# Patient Record
Sex: Female | Born: 1970 | Race: White | Hispanic: No | Marital: Married | Smoking: Never smoker
Health system: Southern US, Community
[De-identification: ages and names within clinical notes are randomized; demographics above are authoritative.]

## PROBLEM LIST (undated history)

## (undated) DIAGNOSIS — G43909 Migraine, unspecified, not intractable, without status migrainosus: Secondary | ICD-10-CM

---

## 2007-11-18 ENCOUNTER — Ambulatory Visit: Payer: Self-pay | Admitting: Family Medicine

## 2008-10-04 ENCOUNTER — Ambulatory Visit: Payer: Self-pay | Admitting: Obstetrics and Gynecology

## 2008-10-07 ENCOUNTER — Ambulatory Visit: Payer: Self-pay | Admitting: Obstetrics and Gynecology

## 2009-12-01 ENCOUNTER — Ambulatory Visit: Payer: Self-pay | Admitting: Obstetrics and Gynecology

## 2010-02-01 ENCOUNTER — Emergency Department: Payer: Self-pay | Admitting: Emergency Medicine

## 2012-12-16 ENCOUNTER — Ambulatory Visit: Payer: Self-pay | Admitting: Obstetrics and Gynecology

## 2013-01-07 ENCOUNTER — Ambulatory Visit: Payer: Self-pay | Admitting: Family Medicine

## 2014-05-03 ENCOUNTER — Ambulatory Visit: Payer: Self-pay | Admitting: Obstetrics and Gynecology

## 2015-08-05 ENCOUNTER — Ambulatory Visit
Admission: EM | Admit: 2015-08-05 | Discharge: 2015-08-05 | Disposition: A | Discharge status: Home / Self Care | Payer: Managed Care, Other (non HMO) | Attending: Family Medicine | Admitting: Family Medicine

## 2015-08-05 DIAGNOSIS — H6982 Other specified disorders of Eustachian tube, left ear: Secondary | ICD-10-CM | POA: Diagnosis not present

## 2015-08-05 HISTORY — DX: Migraine, unspecified, not intractable, without status migrainosus: G43.909

## 2015-08-05 LAB — RAPID STREP SCREEN (MED CTR MEBANE ONLY): STREPTOCOCCUS, GROUP A SCREEN (DIRECT): NEGATIVE

## 2015-08-05 MED ORDER — AZITHROMYCIN 250 MG PO TABS: ORAL_TABLET | ORAL | Status: AC

## 2015-08-05 MED ORDER — FLUTICASONE PROPIONATE 50 MCG/ACT NA SUSP: 2.0000 | Freq: Every day | NASAL | Status: AC

## 2015-08-05 NOTE — ED Provider Notes (Signed)
CSN: [id number]     Arrival date & time 08/05/15  4098 History   First MD Initiated Contact with Patient 08/05/15 [phone]     Chief Complaint  Patient presents with  . URI  . Ear Pain    Left Ear   (Consider location/radiation/quality/duration/timing/severity/associated sxs/prior Treatment) HPI   This very pleasant 45 year old female who presents with left ear pain and sore throat along with the postnasal drainage and a low-grade fever which started 3 days ago. She states that she feels discomfort when she swallows and is all on the left side. She has some tenderness along the left jawline and down into her neck and laterally. She works as the Passenger transport manager at [employer] is contact with the numerous students throughout the day. Do not receive a flu shot this years and she has no allergy to eggs. She does have sensitivities to seasonal allergens.  Past Medical History  Diagnosis Date  . Migraine    Past Surgical History  Procedure Laterality Date  . Cesarean section     History reviewed. No pertinent family history. Social History  Substance Use Topics  . Smoking status: Never Smoker   . Smokeless tobacco: Never Used  . Alcohol Use: No   OB History    No data available     Review of Systems  Constitutional: Positive for fever and activity change. Negative for chills and fatigue.  HENT: Positive for congestion, ear pain, postnasal drip and sinus pressure.   Respiratory: Negative for cough and shortness of breath.   All other systems reviewed and are negative.   Allergies  Eggs or egg-derived products; Ibuprofen; Penicillins; Sulfa antibiotics; and Tetanus toxoids  Home Medications   Prior to Admission medications   Medication Sig Start Date End Date Taking? Authorizing Provider  norgestimate-ethinyl estradiol (ORTHO-CYCLEN,SPRINTEC,PREVIFEM) 0.25-35 MG-MCG tablet Take 1 tablet by mouth daily.   Yes Historical Provider, MD  SUMAtriptan (IMITREX) 50 MG tablet Take 50 mg by  mouth every 2 (two) hours as needed for migraine. May repeat in 2 hours if headache persists or recurs.   Yes Historical Provider, MD  azithromycin (ZITHROMAX Z-PAK) 250 MG tablet Use as per package instructions 08/05/15   Lutricia Feil, PA-C  fluticasone [facility]) 50 MCG/ACT nasal spray Place 2 sprays into both nostrils daily. 08/05/15   Lutricia Feil, PA-C   Meds Ordered and Administered this Visit  Medications - No data to display  BP 117/68 mmHg  Pulse 63  Temp(Src) 98 F (36.7 C) (Oral)  Resp 16  Ht  (1.651 m)  Wt 150 lb (68.04 kg)  BMI 24.96 kg/m2  SpO2 100% No data found.   Physical Exam  Constitutional: She is oriented to person, place, and time. She appears well-developed and well-nourished. No distress.  HENT:  Head: Normocephalic and atraumatic.  Nose: Nose normal.  Mouth/Throat: Oropharynx is clear and moist. No oropharyngeal exudate.  Both ears show a dullness to examination with more prominent on the left. There is tenderness to palpation along the inferior portion of the left ear along the jawline into the neck. The oropharynx is benign. There is no exudate or petechiae seen. He does have some tenderness to percussion over the maxillary sinuses  Eyes: Conjunctivae are normal. Pupils are equal, round, and reactive to light.  Neck: Normal range of motion. Neck supple.  Pulmonary/Chest: Effort normal and breath sounds normal. No respiratory distress. She has no wheezes. She has no rales.  Musculoskeletal: Normal range of motion. She  exhibits no edema or tenderness.  Lymphadenopathy:    She has no cervical adenopathy.  Neurological: She is alert and oriented to person, place, and time.  Skin: Skin is warm and dry. She is not diaphoretic.  Psychiatric: She has a normal mood and affect. Her behavior is normal. Judgment and thought content normal.  Nursing note and vitals reviewed.   ED Course  Procedures (including critical care time)  Labs Review Labs  Reviewed  RAPID STREP SCREEN (NOT AT [facility])  CULTURE, GROUP A STREP [facility])    Imaging Review No results found.   Visual Acuity Review  Right Eye Distance:   Left Eye Distance:   Bilateral Distance:    Right Eye Near:   Left Eye Near:    Bilateral Near:         MDM   1. Dysfunction of left Eustachian tube    Discharge Medication List as of 08/05/2015  9:08 AM    START taking these medications   Details  azithromycin (ZITHROMAX Z-PAK) 250 MG tablet Use as per package instructions, Normal    fluticasone (FLONASE) 50 MCG/ACT nasal spray Place 2 sprays into both nostrils daily., Starting 08/05/2015, Until Discontinued, Normal      Plan: 1. Test/x-ray results and diagnosis reviewed with patient 2. rx as per orders; risks, benefits, potential side effects reviewed with patient 3. Recommend supportive treatment with Fluids. I recommend the use of Flonase to promote drainage and she should continue this for 3-4 weeks. Also start her on a Z-Pak because of her allergy to penicillin for the eustachian tube dysfunction. She's not improving she should follow-up with her primary care or an ENT. I've also asked her to call in 48 hours for the results of the throat swab. 4. F/u prn if symptoms worsen or don't improve     Lutricia FeilWilliam P Roemer, PA-C 08/05/15 [phone]

## 2015-08-05 NOTE — ED Notes (Addendum)
Patient c/o left ear pain, sore throat, drainage, and fever which all started this past Wednesday.  She states that it hurts when she swallows.

## 2015-08-09 LAB — CULTURE, GROUP A STREP (THRC)

## 2015-10-02 ENCOUNTER — Other Ambulatory Visit: Payer: Self-pay | Admitting: Obstetrics and Gynecology

## 2015-10-02 DIAGNOSIS — Z1231 Encounter for screening mammogram for malignant neoplasm of breast: Secondary | ICD-10-CM

## 2015-10-26 ENCOUNTER — Ambulatory Visit
Admission: RE | Admit: 2015-10-26 | Discharge: 2015-10-26 | Disposition: A | Discharge status: Home / Self Care | Payer: Managed Care, Other (non HMO) | Source: Ambulatory Visit | Attending: Obstetrics and Gynecology | Admitting: Obstetrics and Gynecology

## 2015-10-26 ENCOUNTER — Other Ambulatory Visit: Payer: Self-pay | Admitting: Obstetrics and Gynecology

## 2015-10-26 DIAGNOSIS — Z1231 Encounter for screening mammogram for malignant neoplasm of breast: Secondary | ICD-10-CM | POA: Insufficient documentation

## 2015-11-07 ENCOUNTER — Other Ambulatory Visit: Payer: Self-pay | Admitting: Obstetrics and Gynecology

## 2015-11-07 DIAGNOSIS — N6310 Unspecified lump in the right breast, unspecified quadrant: Secondary | ICD-10-CM

## 2015-11-07 DIAGNOSIS — N6459 Other signs and symptoms in breast: Secondary | ICD-10-CM

## 2015-11-10 ENCOUNTER — Ambulatory Visit
Admission: RE | Admit: 2015-11-10 | Discharge: 2015-11-10 | Disposition: A | Discharge status: Home / Self Care | Payer: Managed Care, Other (non HMO) | Source: Ambulatory Visit | Attending: Obstetrics and Gynecology | Admitting: Obstetrics and Gynecology

## 2015-11-10 DIAGNOSIS — N63 Unspecified lump in breast: Secondary | ICD-10-CM | POA: Diagnosis present

## 2015-11-10 DIAGNOSIS — N6459 Other signs and symptoms in breast: Secondary | ICD-10-CM

## 2015-11-10 DIAGNOSIS — N6310 Unspecified lump in the right breast, unspecified quadrant: Secondary | ICD-10-CM

## 2015-11-10 DIAGNOSIS — R6889 Other general symptoms and signs: Secondary | ICD-10-CM | POA: Diagnosis not present

## 2018-01-30 ENCOUNTER — Other Ambulatory Visit: Payer: Self-pay | Admitting: Obstetrics and Gynecology

## 2018-01-30 DIAGNOSIS — Z1231 Encounter for screening mammogram for malignant neoplasm of breast: Secondary | ICD-10-CM

## 2018-02-24 ENCOUNTER — Ambulatory Visit
Admission: RE | Admit: 2018-02-24 | Discharge: 2018-02-24 | Disposition: A | Discharge status: Home / Self Care | Payer: BLUE CROSS/BLUE SHIELD | Source: Ambulatory Visit | Attending: Obstetrics and Gynecology | Admitting: Obstetrics and Gynecology

## 2018-02-24 DIAGNOSIS — Z1231 Encounter for screening mammogram for malignant neoplasm of breast: Secondary | ICD-10-CM | POA: Insufficient documentation

## 2018-07-31 DIAGNOSIS — L820 Inflamed seborrheic keratosis: Secondary | ICD-10-CM | POA: Diagnosis not present

## 2019-04-12 DIAGNOSIS — N809 Endometriosis, unspecified: Secondary | ICD-10-CM | POA: Diagnosis not present

## 2019-04-12 DIAGNOSIS — N939 Abnormal uterine and vaginal bleeding, unspecified: Secondary | ICD-10-CM | POA: Diagnosis not present

## 2019-04-12 DIAGNOSIS — N921 Excessive and frequent menstruation with irregular cycle: Secondary | ICD-10-CM | POA: Diagnosis not present

## 2019-04-12 DIAGNOSIS — Z3041 Encounter for surveillance of contraceptive pills: Secondary | ICD-10-CM | POA: Diagnosis not present

## 2019-05-03 DIAGNOSIS — N939 Abnormal uterine and vaginal bleeding, unspecified: Secondary | ICD-10-CM | POA: Diagnosis not present

## 2019-07-27 ENCOUNTER — Other Ambulatory Visit: Payer: Self-pay | Admitting: Obstetrics and Gynecology

## 2019-07-27 DIAGNOSIS — Z1231 Encounter for screening mammogram for malignant neoplasm of breast: Secondary | ICD-10-CM

## 2019-07-27 DIAGNOSIS — Z131 Encounter for screening for diabetes mellitus: Secondary | ICD-10-CM | POA: Diagnosis not present

## 2019-07-27 DIAGNOSIS — R5383 Other fatigue: Secondary | ICD-10-CM | POA: Diagnosis not present

## 2019-07-27 DIAGNOSIS — Z01419 Encounter for gynecological examination (general) (routine) without abnormal findings: Secondary | ICD-10-CM | POA: Diagnosis not present

## 2019-07-27 DIAGNOSIS — Z0000 Encounter for general adult medical examination without abnormal findings: Secondary | ICD-10-CM | POA: Diagnosis not present

## 2019-07-27 DIAGNOSIS — Z13220 Encounter for screening for lipoid disorders: Secondary | ICD-10-CM | POA: Diagnosis not present

## 2019-08-10 ENCOUNTER — Ambulatory Visit: Payer: BLUE CROSS/BLUE SHIELD

## 2019-08-10 ENCOUNTER — Ambulatory Visit
Admission: RE | Admit: 2019-08-10 | Discharge: 2019-08-10 | Disposition: A | Discharge status: Home / Self Care | Payer: BC Managed Care – PPO | Source: Ambulatory Visit | Attending: Obstetrics and Gynecology | Admitting: Obstetrics and Gynecology

## 2019-08-10 ENCOUNTER — Other Ambulatory Visit: Payer: Self-pay

## 2019-08-10 DIAGNOSIS — Z1231 Encounter for screening mammogram for malignant neoplasm of breast: Secondary | ICD-10-CM | POA: Insufficient documentation

## 2019-08-20 DIAGNOSIS — Z20822 Contact with and (suspected) exposure to covid-19: Secondary | ICD-10-CM | POA: Diagnosis not present

## 2019-11-26 DIAGNOSIS — R079 Chest pain, unspecified: Secondary | ICD-10-CM | POA: Diagnosis not present

## 2019-11-26 DIAGNOSIS — M542 Cervicalgia: Secondary | ICD-10-CM | POA: Diagnosis not present

## 2020-02-04 DIAGNOSIS — F419 Anxiety disorder, unspecified: Secondary | ICD-10-CM | POA: Diagnosis not present

## 2020-02-04 DIAGNOSIS — Z136 Encounter for screening for cardiovascular disorders: Secondary | ICD-10-CM | POA: Diagnosis not present

## 2020-02-04 DIAGNOSIS — Z0000 Encounter for general adult medical examination without abnormal findings: Secondary | ICD-10-CM | POA: Diagnosis not present

## 2020-02-04 DIAGNOSIS — Z131 Encounter for screening for diabetes mellitus: Secondary | ICD-10-CM | POA: Diagnosis not present

## 2020-03-29 DIAGNOSIS — M25541 Pain in joints of right hand: Secondary | ICD-10-CM | POA: Diagnosis not present

## 2020-03-29 DIAGNOSIS — Z23 Encounter for immunization: Secondary | ICD-10-CM | POA: Diagnosis not present

## 2020-06-26 DIAGNOSIS — R1013 Epigastric pain: Secondary | ICD-10-CM | POA: Diagnosis not present

## 2020-06-26 DIAGNOSIS — R1011 Right upper quadrant pain: Secondary | ICD-10-CM | POA: Diagnosis not present

## 2020-06-27 DIAGNOSIS — K8020 Calculus of gallbladder without cholecystitis without obstruction: Secondary | ICD-10-CM | POA: Diagnosis not present

## 2020-06-27 DIAGNOSIS — R1013 Epigastric pain: Secondary | ICD-10-CM | POA: Diagnosis not present

## 2020-08-03 DIAGNOSIS — R768 Other specified abnormal immunological findings in serum: Secondary | ICD-10-CM | POA: Diagnosis not present

## 2020-08-28 DIAGNOSIS — K838 Other specified diseases of biliary tract: Secondary | ICD-10-CM | POA: Diagnosis not present

## 2020-08-28 DIAGNOSIS — K828 Other specified diseases of gallbladder: Secondary | ICD-10-CM | POA: Diagnosis not present

## 2020-08-28 DIAGNOSIS — R1011 Right upper quadrant pain: Secondary | ICD-10-CM | POA: Diagnosis not present

## 2020-08-28 DIAGNOSIS — R112 Nausea with vomiting, unspecified: Secondary | ICD-10-CM | POA: Diagnosis not present

## 2020-08-28 DIAGNOSIS — Z880 Allergy status to penicillin: Secondary | ICD-10-CM | POA: Diagnosis not present

## 2020-08-28 DIAGNOSIS — R0602 Shortness of breath: Secondary | ICD-10-CM | POA: Diagnosis not present

## 2020-08-28 DIAGNOSIS — Z20822 Contact with and (suspected) exposure to covid-19: Secondary | ICD-10-CM | POA: Diagnosis not present

## 2020-08-28 DIAGNOSIS — K8020 Calculus of gallbladder without cholecystitis without obstruction: Secondary | ICD-10-CM | POA: Diagnosis not present

## 2020-08-28 DIAGNOSIS — Z882 Allergy status to sulfonamides status: Secondary | ICD-10-CM | POA: Diagnosis not present

## 2020-08-28 DIAGNOSIS — Z91012 Allergy to eggs: Secondary | ICD-10-CM | POA: Diagnosis not present

## 2020-09-09 DIAGNOSIS — D72829 Elevated white blood cell count, unspecified: Secondary | ICD-10-CM | POA: Diagnosis not present

## 2020-09-09 DIAGNOSIS — K8020 Calculus of gallbladder without cholecystitis without obstruction: Secondary | ICD-10-CM | POA: Diagnosis not present

## 2020-09-09 DIAGNOSIS — K8000 Calculus of gallbladder with acute cholecystitis without obstruction: Secondary | ICD-10-CM | POA: Diagnosis not present

## 2020-09-09 DIAGNOSIS — K8012 Calculus of gallbladder with acute and chronic cholecystitis without obstruction: Secondary | ICD-10-CM | POA: Diagnosis not present

## 2020-09-09 DIAGNOSIS — Z20822 Contact with and (suspected) exposure to covid-19: Secondary | ICD-10-CM | POA: Diagnosis not present

## 2020-09-09 DIAGNOSIS — R1011 Right upper quadrant pain: Secondary | ICD-10-CM | POA: Diagnosis not present

## 2020-09-09 DIAGNOSIS — K8010 Calculus of gallbladder with chronic cholecystitis without obstruction: Secondary | ICD-10-CM | POA: Diagnosis not present

## 2020-09-09 DIAGNOSIS — R1110 Vomiting, unspecified: Secondary | ICD-10-CM | POA: Diagnosis not present

## 2020-09-09 DIAGNOSIS — R112 Nausea with vomiting, unspecified: Secondary | ICD-10-CM | POA: Diagnosis not present

## 2020-09-09 DIAGNOSIS — Z79899 Other long term (current) drug therapy: Secondary | ICD-10-CM | POA: Diagnosis not present

## 2020-09-09 DIAGNOSIS — K660 Peritoneal adhesions (postprocedural) (postinfection): Secondary | ICD-10-CM | POA: Diagnosis not present

## 2020-09-09 DIAGNOSIS — K828 Other specified diseases of gallbladder: Secondary | ICD-10-CM | POA: Diagnosis not present

## 2020-09-10 DIAGNOSIS — K8012 Calculus of gallbladder with acute and chronic cholecystitis without obstruction: Secondary | ICD-10-CM | POA: Diagnosis not present

## 2020-09-26 DIAGNOSIS — R1013 Epigastric pain: Secondary | ICD-10-CM | POA: Diagnosis not present

## 2020-09-28 DIAGNOSIS — R1013 Epigastric pain: Secondary | ICD-10-CM | POA: Diagnosis not present

## 2020-09-28 DIAGNOSIS — K819 Cholecystitis, unspecified: Secondary | ICD-10-CM | POA: Diagnosis not present

## 2020-09-28 DIAGNOSIS — N200 Calculus of kidney: Secondary | ICD-10-CM | POA: Diagnosis not present

## 2020-09-28 DIAGNOSIS — Z9049 Acquired absence of other specified parts of digestive tract: Secondary | ICD-10-CM | POA: Diagnosis not present

## 2020-09-28 DIAGNOSIS — R7989 Other specified abnormal findings of blood chemistry: Secondary | ICD-10-CM | POA: Diagnosis not present

## 2020-10-03 DIAGNOSIS — Z9049 Acquired absence of other specified parts of digestive tract: Secondary | ICD-10-CM | POA: Diagnosis not present

## 2020-10-03 DIAGNOSIS — R1013 Epigastric pain: Secondary | ICD-10-CM | POA: Diagnosis not present

## 2020-10-03 DIAGNOSIS — R7989 Other specified abnormal findings of blood chemistry: Secondary | ICD-10-CM | POA: Diagnosis not present

## 2020-10-24 ENCOUNTER — Other Ambulatory Visit: Payer: Self-pay

## 2020-10-24 ENCOUNTER — Ambulatory Visit
Admission: RE | Admit: 2020-10-24 | Discharge: 2020-10-24 | Disposition: A | Discharge status: Home / Self Care | Payer: BC Managed Care – PPO | Source: Ambulatory Visit | Attending: Obstetrics and Gynecology | Admitting: Obstetrics and Gynecology

## 2020-10-24 ENCOUNTER — Other Ambulatory Visit: Payer: Self-pay | Admitting: Obstetrics and Gynecology

## 2020-10-24 DIAGNOSIS — Z1231 Encounter for screening mammogram for malignant neoplasm of breast: Secondary | ICD-10-CM | POA: Diagnosis not present

## 2020-10-24 DIAGNOSIS — Z1331 Encounter for screening for depression: Secondary | ICD-10-CM | POA: Diagnosis not present

## 2020-10-24 DIAGNOSIS — Z01419 Encounter for gynecological examination (general) (routine) without abnormal findings: Secondary | ICD-10-CM | POA: Diagnosis not present

## 2021-02-23 DIAGNOSIS — G8929 Other chronic pain: Secondary | ICD-10-CM | POA: Diagnosis not present

## 2021-02-23 DIAGNOSIS — R1013 Epigastric pain: Secondary | ICD-10-CM | POA: Diagnosis not present

## 2021-02-23 DIAGNOSIS — R7401 Elevation of levels of liver transaminase levels: Secondary | ICD-10-CM | POA: Diagnosis not present

## 2021-02-27 DIAGNOSIS — R112 Nausea with vomiting, unspecified: Secondary | ICD-10-CM | POA: Diagnosis not present

## 2021-02-27 DIAGNOSIS — R7989 Other specified abnormal findings of blood chemistry: Secondary | ICD-10-CM | POA: Diagnosis not present

## 2021-02-27 DIAGNOSIS — R1013 Epigastric pain: Secondary | ICD-10-CM | POA: Diagnosis not present

## 2021-03-01 DIAGNOSIS — Z9049 Acquired absence of other specified parts of digestive tract: Secondary | ICD-10-CM | POA: Diagnosis not present

## 2021-03-01 DIAGNOSIS — R7989 Other specified abnormal findings of blood chemistry: Secondary | ICD-10-CM | POA: Diagnosis not present

## 2021-04-18 DIAGNOSIS — K449 Diaphragmatic hernia without obstruction or gangrene: Secondary | ICD-10-CM | POA: Diagnosis not present

## 2021-04-18 DIAGNOSIS — Z882 Allergy status to sulfonamides status: Secondary | ICD-10-CM | POA: Diagnosis not present

## 2021-04-18 DIAGNOSIS — D126 Benign neoplasm of colon, unspecified: Secondary | ICD-10-CM | POA: Diagnosis not present

## 2021-04-18 DIAGNOSIS — Z800 Family history of malignant neoplasm of digestive organs: Secondary | ICD-10-CM | POA: Diagnosis not present

## 2021-04-18 DIAGNOSIS — Z886 Allergy status to analgesic agent status: Secondary | ICD-10-CM | POA: Diagnosis not present

## 2021-04-18 DIAGNOSIS — Z880 Allergy status to penicillin: Secondary | ICD-10-CM | POA: Diagnosis not present

## 2021-04-18 DIAGNOSIS — K644 Residual hemorrhoidal skin tags: Secondary | ICD-10-CM | POA: Diagnosis not present

## 2021-04-18 DIAGNOSIS — R1013 Epigastric pain: Secondary | ICD-10-CM | POA: Diagnosis not present

## 2021-04-18 DIAGNOSIS — K2960 Other gastritis without bleeding: Secondary | ICD-10-CM | POA: Diagnosis not present

## 2021-04-18 DIAGNOSIS — K31A19 Gastric intestinal metaplasia without dysplasia, unspecified site: Secondary | ICD-10-CM | POA: Diagnosis not present

## 2021-04-18 DIAGNOSIS — K2950 Unspecified chronic gastritis without bleeding: Secondary | ICD-10-CM | POA: Diagnosis not present

## 2021-04-18 DIAGNOSIS — Z1211 Encounter for screening for malignant neoplasm of colon: Secondary | ICD-10-CM | POA: Diagnosis not present

## 2021-04-18 DIAGNOSIS — K31A12 Gastric intestinal metaplasia without dysplasia, involving the body (corpus): Secondary | ICD-10-CM | POA: Diagnosis not present

## 2021-04-18 DIAGNOSIS — K640 First degree hemorrhoids: Secondary | ICD-10-CM | POA: Diagnosis not present

## 2021-04-18 DIAGNOSIS — D123 Benign neoplasm of transverse colon: Secondary | ICD-10-CM | POA: Diagnosis not present

## 2021-05-30 DIAGNOSIS — N3000 Acute cystitis without hematuria: Secondary | ICD-10-CM | POA: Diagnosis not present

## 2021-05-30 DIAGNOSIS — N390 Urinary tract infection, site not specified: Secondary | ICD-10-CM | POA: Diagnosis not present

## 2021-05-30 DIAGNOSIS — R112 Nausea with vomiting, unspecified: Secondary | ICD-10-CM | POA: Diagnosis not present

## 2021-05-30 DIAGNOSIS — K31A0 Gastric intestinal metaplasia, unspecified: Secondary | ICD-10-CM | POA: Diagnosis not present

## 2021-05-30 DIAGNOSIS — R7989 Other specified abnormal findings of blood chemistry: Secondary | ICD-10-CM | POA: Diagnosis not present

## 2021-06-05 DIAGNOSIS — Z888 Allergy status to other drugs, medicaments and biological substances status: Secondary | ICD-10-CM | POA: Diagnosis not present

## 2021-06-05 DIAGNOSIS — R1032 Left lower quadrant pain: Secondary | ICD-10-CM | POA: Diagnosis not present

## 2021-06-05 DIAGNOSIS — K2270 Barrett's esophagus without dysplasia: Secondary | ICD-10-CM | POA: Diagnosis not present

## 2021-06-05 DIAGNOSIS — R1310 Dysphagia, unspecified: Secondary | ICD-10-CM | POA: Diagnosis not present

## 2021-06-05 DIAGNOSIS — K31A0 Gastric intestinal metaplasia, unspecified: Secondary | ICD-10-CM | POA: Diagnosis not present

## 2021-06-05 DIAGNOSIS — Z880 Allergy status to penicillin: Secondary | ICD-10-CM | POA: Diagnosis not present

## 2021-06-05 DIAGNOSIS — Z882 Allergy status to sulfonamides status: Secondary | ICD-10-CM | POA: Diagnosis not present

## 2021-06-05 DIAGNOSIS — R109 Unspecified abdominal pain: Secondary | ICD-10-CM | POA: Diagnosis not present

## 2021-06-05 DIAGNOSIS — Z79899 Other long term (current) drug therapy: Secondary | ICD-10-CM | POA: Diagnosis not present

## 2021-06-05 DIAGNOSIS — G43909 Migraine, unspecified, not intractable, without status migrainosus: Secondary | ICD-10-CM | POA: Diagnosis not present

## 2021-06-05 DIAGNOSIS — N8000 Endometriosis of the uterus, unspecified: Secondary | ICD-10-CM | POA: Diagnosis not present

## 2021-06-05 DIAGNOSIS — K317 Polyp of stomach and duodenum: Secondary | ICD-10-CM | POA: Diagnosis not present

## 2021-06-05 DIAGNOSIS — K3189 Other diseases of stomach and duodenum: Secondary | ICD-10-CM | POA: Diagnosis not present

## 2021-06-05 DIAGNOSIS — K229 Disease of esophagus, unspecified: Secondary | ICD-10-CM | POA: Diagnosis not present

## 2021-06-05 DIAGNOSIS — K449 Diaphragmatic hernia without obstruction or gangrene: Secondary | ICD-10-CM | POA: Diagnosis not present

## 2021-07-09 DIAGNOSIS — R7989 Other specified abnormal findings of blood chemistry: Secondary | ICD-10-CM | POA: Diagnosis not present

## 2021-07-19 DIAGNOSIS — R112 Nausea with vomiting, unspecified: Secondary | ICD-10-CM | POA: Diagnosis not present

## 2021-07-27 DIAGNOSIS — R7989 Other specified abnormal findings of blood chemistry: Secondary | ICD-10-CM | POA: Diagnosis not present

## 2021-07-27 DIAGNOSIS — R1011 Right upper quadrant pain: Secondary | ICD-10-CM | POA: Diagnosis not present

## 2021-08-01 DIAGNOSIS — Z9049 Acquired absence of other specified parts of digestive tract: Secondary | ICD-10-CM | POA: Diagnosis not present

## 2021-08-01 DIAGNOSIS — K8050 Calculus of bile duct without cholangitis or cholecystitis without obstruction: Secondary | ICD-10-CM | POA: Diagnosis not present

## 2021-08-01 DIAGNOSIS — R109 Unspecified abdominal pain: Secondary | ICD-10-CM | POA: Diagnosis not present

## 2021-08-01 DIAGNOSIS — K8051 Calculus of bile duct without cholangitis or cholecystitis with obstruction: Secondary | ICD-10-CM | POA: Diagnosis not present

## 2021-08-01 DIAGNOSIS — Z79899 Other long term (current) drug therapy: Secondary | ICD-10-CM | POA: Diagnosis not present

## 2021-08-01 DIAGNOSIS — R932 Abnormal findings on diagnostic imaging of liver and biliary tract: Secondary | ICD-10-CM | POA: Diagnosis not present

## 2021-08-01 DIAGNOSIS — Z9889 Other specified postprocedural states: Secondary | ICD-10-CM | POA: Diagnosis not present

## 2021-09-19 DIAGNOSIS — N3000 Acute cystitis without hematuria: Secondary | ICD-10-CM | POA: Diagnosis not present

## 2021-12-24 DIAGNOSIS — R300 Dysuria: Secondary | ICD-10-CM | POA: Diagnosis not present

## 2022-01-16 DIAGNOSIS — N390 Urinary tract infection, site not specified: Secondary | ICD-10-CM | POA: Diagnosis not present

## 2022-01-16 DIAGNOSIS — Z1231 Encounter for screening mammogram for malignant neoplasm of breast: Secondary | ICD-10-CM | POA: Diagnosis not present

## 2022-01-16 DIAGNOSIS — Z01419 Encounter for gynecological examination (general) (routine) without abnormal findings: Secondary | ICD-10-CM | POA: Diagnosis not present

## 2022-02-03 IMAGING — MG MM DIGITAL SCREENING BILAT W/ TOMO AND CAD
8 series · 9 of 24 positions shown · non-contrast
Comparison: Previous exam(s).

CLINICAL DATA: Screening.

EXAM:
DIGITAL SCREENING BILATERAL MAMMOGRAM WITH TOMOSYNTHESIS AND CAD
TECHNIQUE: Bilateral screening digital craniocaudal and mediolateral oblique
mammograms were obtained. Bilateral screening digital breast
tomosynthesis was performed. The images were evaluated with
computer-aided detection.

[R MLO synth-2D]
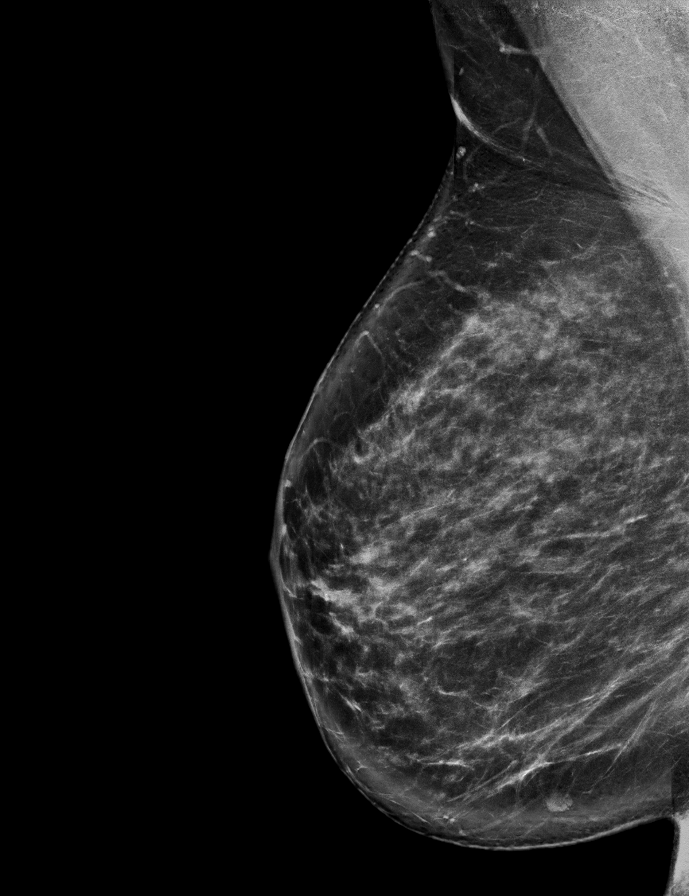

[R CC synth-2D]
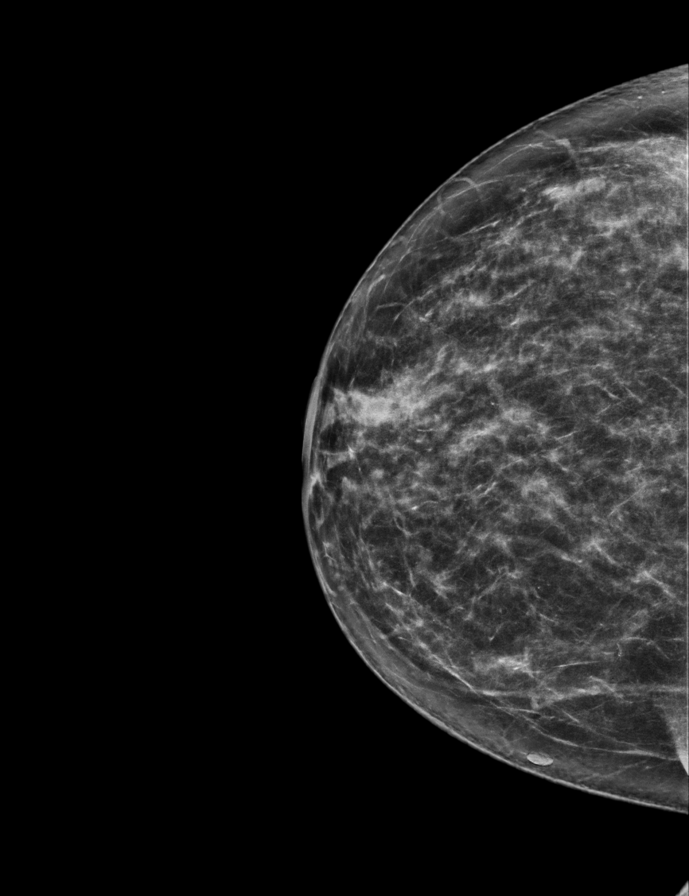

[L CC synth-2D]
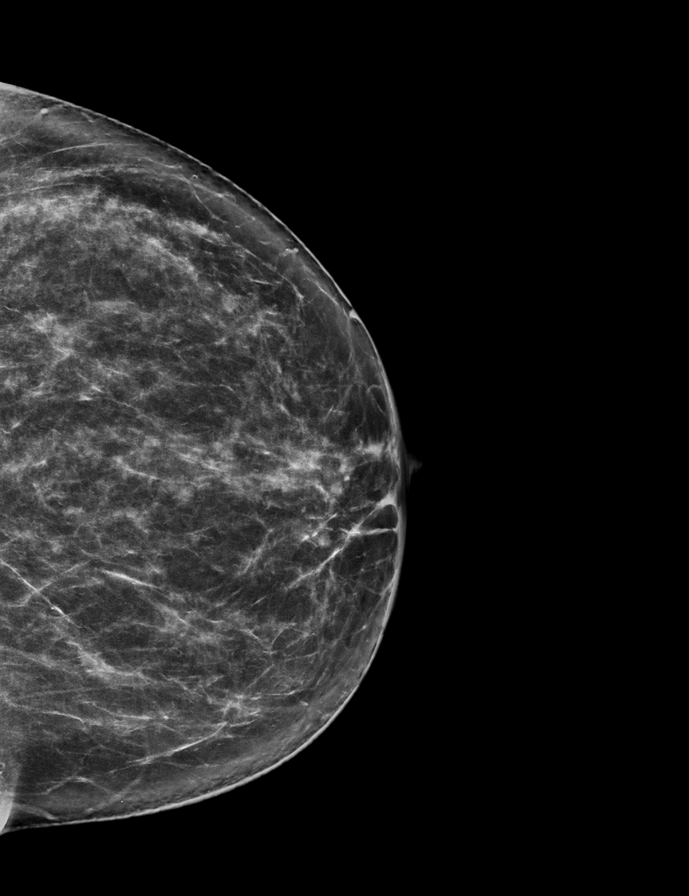

[L MLO synth-2D]
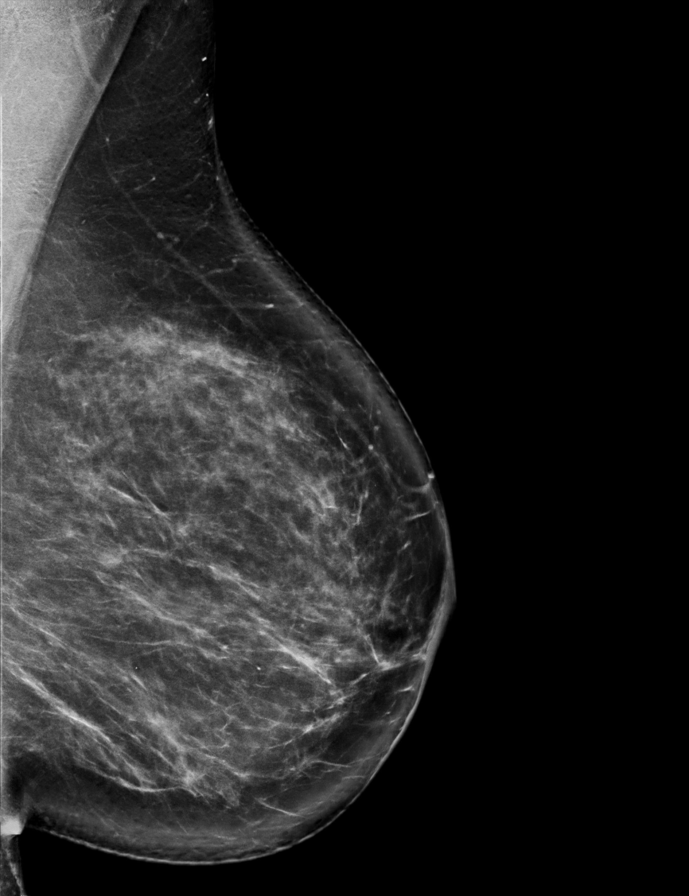

[L MLO tomo · 2 of 93 frames shown]
[frame 30/93]
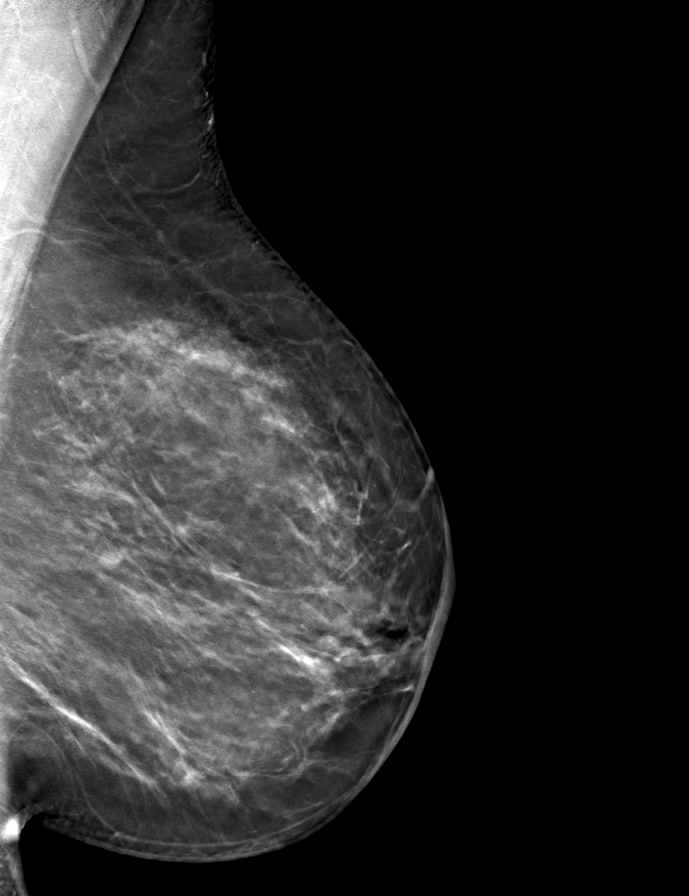
[frame 47/93]
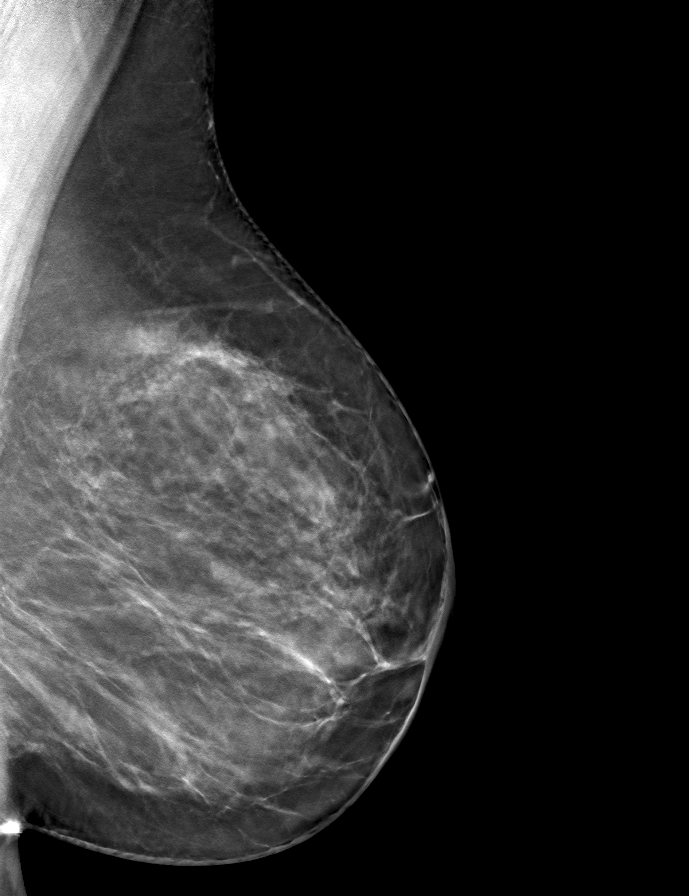

[R MLO tomo · tomo slice 44/87.0]
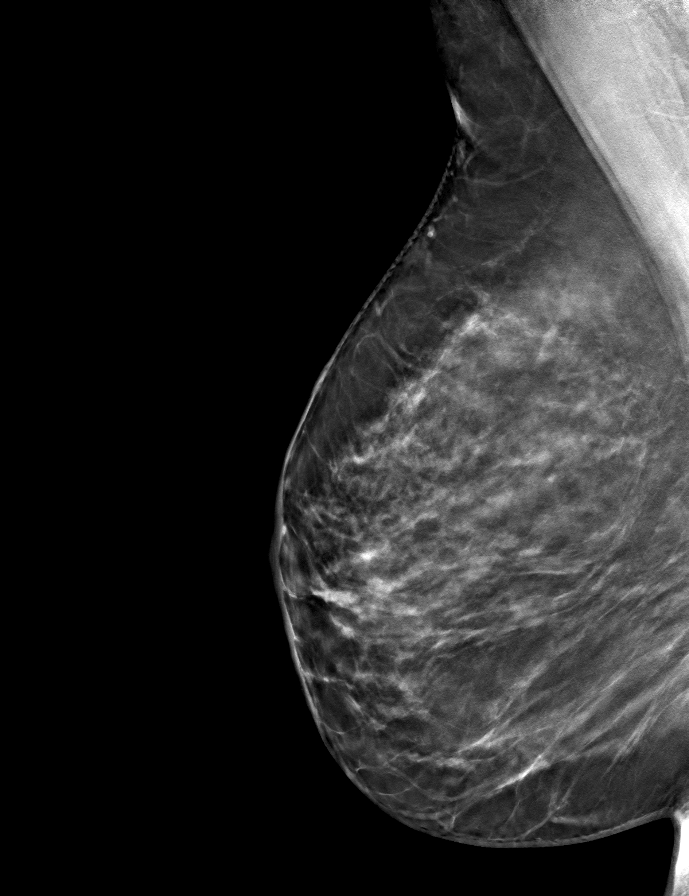

[L CC tomo · tomo slice 39/77.0]
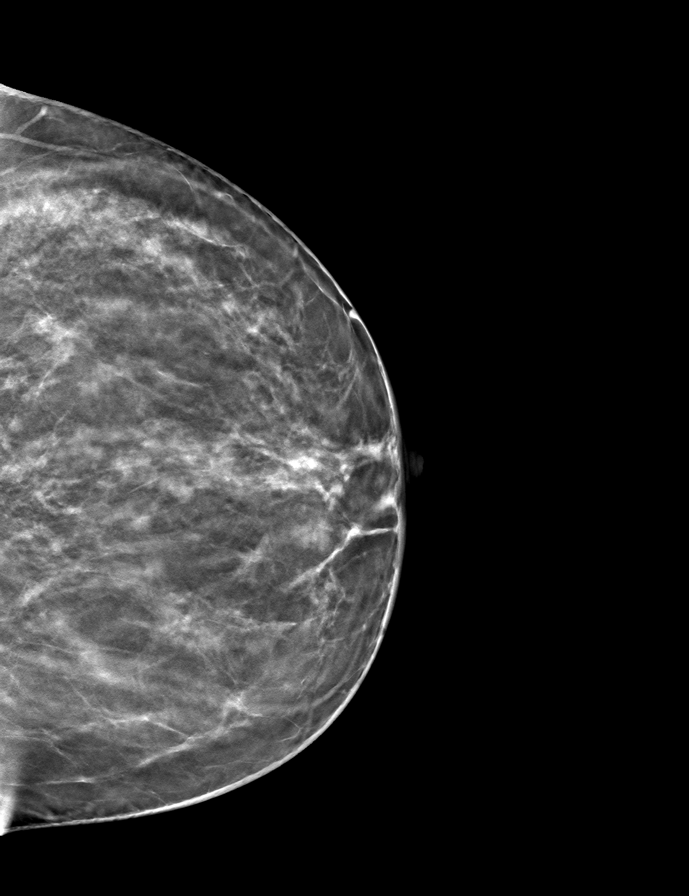

[R CC tomo · tomo slice 37/73.0]
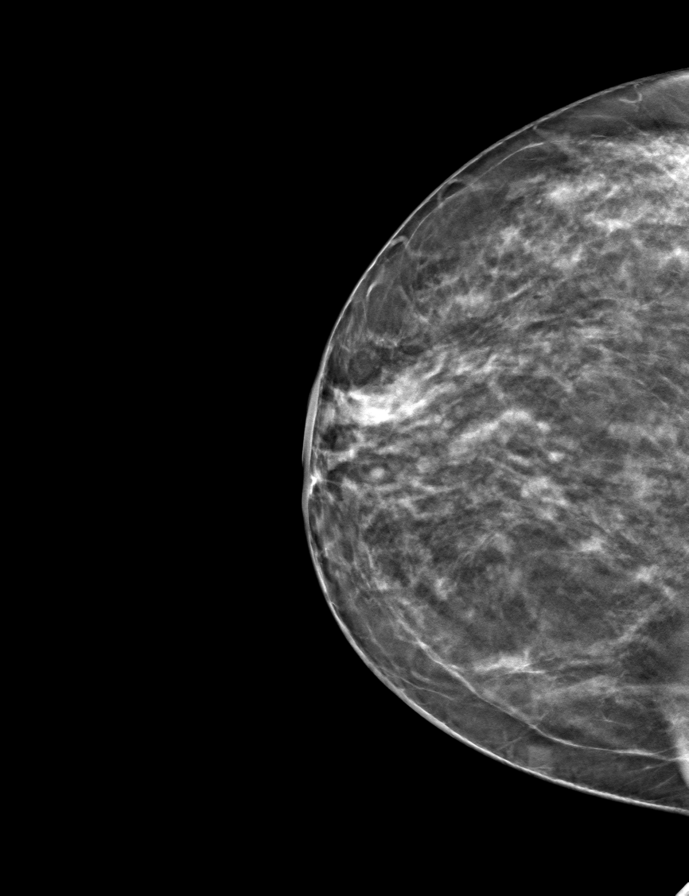

[9 of 24 positions shown; findings below may reference images not displayed]

ACR Breast Density Category b: There are scattered areas of
fibroglandular density.
FINDINGS: There are no findings suspicious for malignancy. The images were
evaluated with computer-aided detection.
IMPRESSION: No mammographic evidence of malignancy. A result letter of this
screening mammogram will be mailed directly to the patient.

RECOMMENDATION:
Screening mammogram in one year. (Code:WJ-[id number])

BI-RADS CATEGORY  1: Negative.

## 2022-03-11 DIAGNOSIS — K8020 Calculus of gallbladder without cholecystitis without obstruction: Secondary | ICD-10-CM | POA: Diagnosis not present

## 2022-03-11 DIAGNOSIS — R7989 Other specified abnormal findings of blood chemistry: Secondary | ICD-10-CM | POA: Diagnosis not present

## 2022-09-24 DIAGNOSIS — F959 Tic disorder, unspecified: Secondary | ICD-10-CM | POA: Diagnosis not present

## 2022-09-24 DIAGNOSIS — M2550 Pain in unspecified joint: Secondary | ICD-10-CM | POA: Diagnosis not present

## 2022-09-24 DIAGNOSIS — E782 Mixed hyperlipidemia: Secondary | ICD-10-CM | POA: Diagnosis not present

## 2022-09-24 DIAGNOSIS — G245 Blepharospasm: Secondary | ICD-10-CM | POA: Diagnosis not present

## 2023-02-03 ENCOUNTER — Other Ambulatory Visit: Payer: Self-pay | Admitting: Obstetrics and Gynecology

## 2023-02-03 DIAGNOSIS — Z1331 Encounter for screening for depression: Secondary | ICD-10-CM | POA: Diagnosis not present

## 2023-02-03 DIAGNOSIS — Z01419 Encounter for gynecological examination (general) (routine) without abnormal findings: Secondary | ICD-10-CM | POA: Diagnosis not present

## 2023-02-03 DIAGNOSIS — Z1231 Encounter for screening mammogram for malignant neoplasm of breast: Secondary | ICD-10-CM

## 2023-06-04 ENCOUNTER — Inpatient Hospital Stay: Admission: RE | Admit: 2023-06-04 | Payer: BC Managed Care – PPO | Source: Ambulatory Visit

## 2023-06-11 ENCOUNTER — Ambulatory Visit
Admission: RE | Admit: 2023-06-11 | Discharge: 2023-06-11 | Disposition: A | Discharge status: Home / Self Care | Payer: 59 | Source: Ambulatory Visit | Attending: Obstetrics and Gynecology | Admitting: Obstetrics and Gynecology

## 2023-06-11 DIAGNOSIS — Z1231 Encounter for screening mammogram for malignant neoplasm of breast: Secondary | ICD-10-CM | POA: Diagnosis present
